# Patient Record
Sex: Male | Born: 1960 | Race: White | Hispanic: No | Marital: Married | Smoking: Former smoker
Health system: Southern US, Community
[De-identification: ages and names within clinical notes are randomized; demographics above are authoritative.]

## PROBLEM LIST (undated history)

## (undated) DIAGNOSIS — N400 Enlarged prostate without lower urinary tract symptoms: Secondary | ICD-10-CM

## (undated) DIAGNOSIS — E785 Hyperlipidemia, unspecified: Secondary | ICD-10-CM

## (undated) HISTORY — DX: Hyperlipidemia, unspecified: E78.5

## (undated) HISTORY — DX: Benign prostatic hyperplasia without lower urinary tract symptoms: N40.0

## (undated) HISTORY — PX: KNEE ARTHROSCOPY: SUR90

---

## 2003-06-26 LAB — PULMONARY FUNCTION TEST

## 2005-02-12 ENCOUNTER — Emergency Department: Payer: Self-pay | Admitting: Unknown Physician Specialty

## 2005-02-15 ENCOUNTER — Emergency Department: Payer: Self-pay | Admitting: Internal Medicine

## 2005-02-19 ENCOUNTER — Emergency Department: Payer: Self-pay | Admitting: Emergency Medicine

## 2005-02-23 ENCOUNTER — Emergency Department: Payer: Self-pay | Admitting: Emergency Medicine

## 2005-02-26 ENCOUNTER — Emergency Department: Payer: Self-pay | Admitting: Emergency Medicine

## 2005-03-12 ENCOUNTER — Emergency Department: Payer: Self-pay | Admitting: Emergency Medicine

## 2015-12-13 ENCOUNTER — Encounter: Payer: Self-pay | Admitting: Family Medicine

## 2015-12-13 ENCOUNTER — Ambulatory Visit (INDEPENDENT_AMBULATORY_CARE_PROVIDER_SITE_OTHER): Payer: TRICARE For Life (TFL) | Admitting: Family Medicine

## 2015-12-13 VITALS — BP 132/90 | HR 66 | Temp 98.4°F | Ht 70.75 in | Wt 204.7 lb

## 2015-12-13 DIAGNOSIS — R030 Elevated blood-pressure reading, without diagnosis of hypertension: Secondary | ICD-10-CM | POA: Diagnosis not present

## 2015-12-13 NOTE — Progress Notes (Signed)
Patient ID: Troy Mcguire, male   DOB: [birth year], 55 y.o.   MRN: [id number] Patient presents to clinic today to establish care.   Acute issue: Borderline diastolic BP noted upon recheck of BP. He does not currently monitor BP at home and does not have a history of HTN. He reports monitoring his BP several years ago when he stopped smoking but has not monitored this recently. Denies chest pain, palpitations, headaches, nosebleeds, numbness, tingling, SOB, dyspnea, and edema.  No history of HTN as noted and no previous treatments for BP. He also does not monitor his salt intake but does exercise regularly.  Health Maintenance: Dental -- Has dentures and sees dentist annually.  Vision -- Yearly Immunizations -- Last tetanus given 2 years ago at the urgent care Colonoscopy --Needed  Walking for exercise approximately 3 miles a day without cardiopulmonary symptoms. Diet is a modified heart healthy diet but patient reports that he does not monitor his salt intake. Does not eat as many vegetables as he would like to but has a garden and plans to increase vegetable intake.   Review of Systems  Constitutional: Negative for chills.  HENT: Negative for congestion and nosebleeds.   Eyes: Negative for blurred vision and double vision.  Respiratory: Negative for cough, shortness of breath and wheezing.   Cardiovascular: Negative for chest pain, palpitations, orthopnea, claudication, leg swelling and PND.  Gastrointestinal: Negative for heartburn, nausea, vomiting, abdominal pain, blood in stool and melena.  Genitourinary: Negative for dysuria, urgency, frequency and hematuria.  Musculoskeletal: Negative for myalgias, back pain and joint pain.  Neurological: Negative for dizziness, tingling, sensory change, weakness and headaches.  Psychiatric/Behavioral:       Denies depressed or anxious mood   History reviewed. No pertinent past medical history.   Social History   Social History  . Marital  Status: Married    Spouse Name: N/A  . Number of Children: N/A  . Years of Education: N/A   Occupational History  . Not on file.   Social History Main Topics  . Smoking status: Former Smoker -- 1.00 packs/day for 30 years    Types: Cigars, Cigarettes    Quit date: 12/13/2010  . Smokeless tobacco: Never Used  . Alcohol Use: 1.2 oz/week    2 Standard drinks or equivalent per week     Comment: 2 drinks/beer/wine weekly.   . Drug Use: Not on file  . Sexual Activity: Not on file   Other Topics Concern  . 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 No pertinent past surgical history.  Family History  Problem Relation Age of Onset  . Heart disease Mother   . Leukemia Father     No Known Allergies  No current outpatient prescriptions on file prior to visit.   No current facility-administered medications on file prior to visit.    BP 130/90 mmHg  Pulse 66  Temp(Src) 98.4 F (36.9 C)  Ht 5' 10.75" (1.797 m)  Wt 204 lb 11.2 oz (92.851 kg)  BMI 28.75 kg/m2  SpO2 98%     BP 130/90 mmHg  Pulse 66  Temp(Src) 98.4 F (36.9 C)  Ht 5' 10.75" (1.797 m)  Wt 204 lb 11.2 oz (92.851 kg)  BMI 28.75 kg/m2  SpO2 98%  Physical Exam  Constitutional: He is oriented to person, place, and time and well-developed, well-nourished, and in no distress.  HENT:  Head: Normocephalic.  Mouth/Throat: Oropharynx is clear and moist. No oropharyngeal exudate.  Eyes: Pupils are equal, round, and reactive to light. No scleral icterus.  Neck: Neck supple.  Cardiovascular: Normal rate, regular rhythm, normal heart sounds and intact distal pulses.   Pulmonary/Chest: Effort normal and breath sounds normal. He has no wheezes. He has no rales.  Abdominal: Soft. Bowel sounds are normal. There is no tenderness.  Lymphadenopathy:    He has no cervical adenopathy.  Neurological: He is alert and oriented to person, place, and time. Gait  normal. Coordination normal.  Skin: Skin is warm and dry.  Psychiatric: Mood, memory, affect and judgment normal.    Assessment/Plan: 1. Prehypertension Borderline diastolic of 90 noted with 2 readings during assessment today. Advised BP monitoring and provided parameters to report. Patient is scheduled for an upcoming physical and he has been advised to monitor his BP and bring readings and home cuff with him to his next visit if BP is noted to be WNL using the parameters provided for him. Further advised patient to follow up sooner if readings are >140/90. Reviewed and recommended DASH diet with written instructions provided. Patient will communicate readings via MYCHART.  Future lab orders have been placed and patient will schedule these when he is fasting prior to his upcoming physical.  Advised patient that a screening colonoscopy is recommended. He stated that he will consider this and let me know at his upcoming physical. Also discussed release of record form if he is interested in sharing his last physical that was completed at urgent care approximately 2 years ago per patient.  Return for physical as scheduled. Patient voiced understanding and agreed with plan.  - CBC with Differential/Platelet; Future - Basic metabolic panel; Future - Hepatic function panel; Future - Lipid panel; Future - PSA; Future  Roddie McJulia Carrell Rahmani, FNP-C  .

## 2015-12-13 NOTE — Patient Instructions (Signed)
It was a pleasure to see you today!  Please monitor your BP. Minimal Blood Pressure Goal= AVERAGE < 140/90; Ideal is an AVERAGE < 135/85. This AVERAGE should be calculated from @ least 5-7 BP readings taken @ different times of day on different days of week. You should not respond to isolated BP readings , but rather the AVERAGE for that week .Please bring your blood pressure cuff to office visits to verify that it is reliable.It can also be checked against the blood pressure device at the pharmacy. Finger or wrist cuffs are not dependable; an arm cuff is.    DASH Eating Plan DASH stands for "Dietary Approaches to Stop Hypertension." The DASH eating plan is a healthy eating plan that has been shown to reduce high blood pressure (hypertension). Additional health benefits may include reducing the risk of type 2 diabetes mellitus, heart disease, and stroke. The DASH eating plan may also help with weight loss. WHAT DO I NEED TO KNOW ABOUT THE DASH EATING PLAN? For the DASH eating plan, you will follow these general guidelines:  Choose foods with a percent daily value for sodium of less than 5% (as listed on the food label).  Use salt-free seasonings or herbs instead of table salt or sea salt.  Check with your health care provider or pharmacist before using salt substitutes.  Eat lower-sodium products, often labeled as "lower sodium" or "no salt added."  Eat fresh foods.  Eat more vegetables, fruits, and low-fat dairy products.  Choose whole grains. Look for the word "whole" as the first word in the ingredient list.  Choose fish and skinless chicken or [place] more often than red meat. Limit fish, poultry, and meat to 6 oz (170 g) each day.  Limit sweets, desserts, sugars, and sugary drinks.  Choose heart-healthy fats.  Limit cheese to 1 oz (28 g) per day.  Eat more home-cooked food and less restaurant, buffet, and fast food.  Limit fried foods.  Cook foods using methods other than  frying.  Limit canned vegetables. If you do use them, rinse them well to decrease the sodium.  When eating at a restaurant, ask that your food be prepared with less salt, or no salt if possible. WHAT FOODS CAN I EAT? Seek help from a dietitian for individual calorie needs. Grains Whole grain or whole wheat bread. Brown rice. Whole grain or whole wheat pasta. Quinoa, bulgur, and whole grain cereals. Low-sodium cereals. Corn or whole wheat flour tortillas. Whole grain cornbread. Whole grain crackers. Low-sodium crackers. Vegetables Fresh or frozen vegetables (raw, steamed, roasted, or grilled). Low-sodium or reduced-sodium tomato and vegetable juices. Low-sodium or reduced-sodium tomato sauce and paste. Low-sodium or reduced-sodium canned vegetables.  Fruits All fresh, canned (in natural juice), or frozen fruits. Meat and Other Protein Products Ground beef (85% or leaner), grass-fed beef, or beef trimmed of fat. Skinless chicken or [place]. Ground chicken or [place]. Pork trimmed of fat. All fish and seafood. Eggs. Dried beans, peas, or lentils. Unsalted nuts and seeds. Unsalted canned beans. Dairy Low-fat dairy products, such as skim or 1% milk, 2% or reduced-fat cheeses, low-fat ricotta or cottage cheese, or plain low-fat yogurt. Low-sodium or reduced-sodium cheeses. Fats and Oils Tub margarines without trans fats. Light or reduced-fat mayonnaise and salad dressings (reduced sodium). Avocado. Safflower, olive, or canola oils. Natural peanut or almond butter. Other Unsalted popcorn and pretzels. The items listed above may not be a complete list of recommended foods or beverages. Contact your dietitian for more options. WHAT  FOODS ARE NOT RECOMMENDED? Grains White bread. White pasta. White rice. Refined cornbread. Bagels and croissants. Crackers that contain trans fat. Vegetables Creamed or fried vegetables. Vegetables in a cheese sauce. Regular canned vegetables. Regular canned tomato sauce  and paste. Regular tomato and vegetable juices. Fruits Dried fruits. Canned fruit in light or heavy syrup. Fruit juice. Meat and Other Protein Products Fatty cuts of meat. Ribs, chicken wings, bacon, sausage, bologna, salami, chitterlings, fatback, hot dogs, bratwurst, and packaged luncheon meats. Salted nuts and seeds. Canned beans with salt. Dairy Whole or 2% milk, cream, half-and-half, and cream cheese. Whole-fat or sweetened yogurt. Full-fat cheeses or blue cheese. Nondairy creamers and whipped toppings. Processed cheese, cheese spreads, or cheese curds. Condiments Onion and garlic salt, seasoned salt, table salt, and sea salt. Canned and packaged gravies. Worcestershire sauce. Tartar sauce. Barbecue sauce. Teriyaki sauce. Soy sauce, including reduced sodium. Steak sauce. Fish sauce. Oyster sauce. Cocktail sauce. Horseradish. Ketchup and mustard. Meat flavorings and tenderizers. Bouillon cubes. Hot sauce. Tabasco sauce. Marinades. Taco seasonings. Relishes. Fats and Oils Butter, stick margarine, lard, shortening, ghee, and bacon fat. Coconut, palm kernel, or palm oils. Regular salad dressings. Other Pickles and olives. Salted popcorn and pretzels. The items listed above may not be a complete list of foods and beverages to avoid. Contact your dietitian for more information. WHERE CAN I FIND MORE INFORMATION? National Heart, Lung, and Blood Institute: CablePromo.itwww.nhlbi.nih.gov/health/health-topics/topics/dash/   This information is not intended to replace advice given to you by your health care provider. Make sure you discuss any questions you have with your health care provider.   Document Released: 06/01/2011 Document Revised: 07/03/2014 Document Reviewed: 04/16/2013 Elsevier Interactive Patient Education [employer]2016 Elsevier Inc.

## 2016-01-17 ENCOUNTER — Other Ambulatory Visit (INDEPENDENT_AMBULATORY_CARE_PROVIDER_SITE_OTHER): Payer: TRICARE For Life (TFL)

## 2016-01-17 DIAGNOSIS — R030 Elevated blood-pressure reading, without diagnosis of hypertension: Secondary | ICD-10-CM

## 2016-01-17 LAB — BASIC METABOLIC PANEL
BUN: 13 mg/dL (ref 6–23)
CHLORIDE: 106 meq/L (ref 96–112)
CO2: 28 mEq/L (ref 19–32)
Calcium: 9.4 mg/dL (ref 8.4–10.5)
Creatinine, Ser: 1.2 mg/dL (ref 0.40–1.50)
GFR: 66.27 mL/min (ref >60.00)
Glucose, Bld: 89 mg/dL (ref 70–99)
POTASSIUM: 4.8 meq/L (ref 3.5–5.1)
SODIUM: 140 meq/L (ref 135–145)

## 2016-01-17 LAB — CBC WITH DIFFERENTIAL/PLATELET
BASOS ABS: 0 10*3/uL (ref 0.0–0.1)
BASOS PCT: 0.6 % (ref 0.0–3.0)
EOS ABS: 0.3 10*3/uL (ref 0.0–0.7)
Eosinophils Relative: 3.7 % (ref 0.0–5.0)
HEMATOCRIT: 45.4 % (ref 39.0–52.0)
Hemoglobin: 15.3 g/dL (ref 13.0–17.0)
LYMPHS ABS: 2.2 10*3/uL (ref 0.7–4.0)
Lymphocytes Relative: 30.9 % (ref 12.0–46.0)
MCHC: 33.7 g/dL (ref 30.0–36.0)
MCV: 90.8 fl (ref 78.0–100.0)
MONO ABS: 0.6 10*3/uL (ref 0.1–1.0)
Monocytes Relative: 7.8 % (ref 3.0–12.0)
NEUTROS ABS: 4.1 10*3/uL (ref 1.4–7.7)
NEUTROS PCT: 57 % (ref 43.0–77.0)
PLATELETS: 214 10*3/uL (ref 150.0–400.0)
RBC: 5 Mil/uL (ref 4.22–5.81)
RDW: 14 % (ref 11.5–15.5)
WBC: 7.1 10*3/uL (ref 4.0–10.5)

## 2016-01-17 LAB — POC URINALSYSI DIPSTICK (AUTOMATED)
BILIRUBIN UA: NEGATIVE
Blood, UA: NEGATIVE
Glucose, UA: NEGATIVE
KETONES UA: NEGATIVE
LEUKOCYTES UA: NEGATIVE
Nitrite, UA: NEGATIVE
PH UA: 5.5
PROTEIN UA: NEGATIVE
Urobilinogen, UA: 0.2

## 2016-01-17 LAB — HEPATIC FUNCTION PANEL
ALT: 18 U/L (ref 0–53)
AST: 16 U/L (ref 0–37)
Albumin: 4.2 g/dL (ref 3.5–5.2)
Alkaline Phosphatase: 65 U/L (ref 39–117)
BILIRUBIN DIRECT: 0.1 mg/dL (ref 0.0–0.3)
BILIRUBIN TOTAL: 0.4 mg/dL (ref 0.2–1.2)
Total Protein: 6.4 g/dL (ref 6.0–8.3)

## 2016-01-17 LAB — LIPID PANEL
CHOL/HDL RATIO: 6
CHOLESTEROL: 186 mg/dL (ref 0–200)
HDL: 30.5 mg/dL — ABNORMAL LOW (ref >39.00)
LDL CALC: 123 mg/dL — AB (ref 0–99)
NonHDL: 155.98
Triglycerides: 164 mg/dL — ABNORMAL HIGH (ref 0.0–149.0)
VLDL: 32.8 mg/dL (ref 0.0–40.0)

## 2016-01-17 LAB — TSH: TSH: 1.69 u[IU]/mL (ref 0.35–4.50)

## 2016-01-17 LAB — PSA: PSA: 1.66 ng/mL (ref 0.10–4.00)

## 2016-01-24 ENCOUNTER — Ambulatory Visit (INDEPENDENT_AMBULATORY_CARE_PROVIDER_SITE_OTHER): Payer: TRICARE For Life (TFL) | Admitting: Family Medicine

## 2016-01-24 ENCOUNTER — Ambulatory Visit: Payer: Self-pay | Admitting: Family Medicine

## 2016-01-24 ENCOUNTER — Encounter: Payer: Self-pay | Admitting: Family Medicine

## 2016-01-24 VITALS — BP 130/88 | HR 62 | Temp 97.5°F | Ht 69.5 in | Wt 205.4 lb

## 2016-01-24 DIAGNOSIS — R030 Elevated blood-pressure reading, without diagnosis of hypertension: Secondary | ICD-10-CM

## 2016-01-24 DIAGNOSIS — Z23 Encounter for immunization: Secondary | ICD-10-CM

## 2016-01-24 DIAGNOSIS — E784 Other hyperlipidemia: Secondary | ICD-10-CM | POA: Diagnosis not present

## 2016-01-24 DIAGNOSIS — Z0001 Encounter for general adult medical examination with abnormal findings: Secondary | ICD-10-CM | POA: Diagnosis not present

## 2016-01-24 DIAGNOSIS — E785 Hyperlipidemia, unspecified: Secondary | ICD-10-CM

## 2016-01-24 MED ORDER — TETANUS-DIPHTH-ACELL PERTUSSIS 5-2.5-18.5 LF-MCG/0.5 IM SUSP: 0.5000 mL | Freq: Once | INTRAMUSCULAR | Status: DC

## 2016-01-24 NOTE — Patient Instructions (Signed)
It was a pleasure seeking you today! Please schedule a 6 month follow up for a cholesterol check and we can discuss anything else at that time.  Please let me know if you would like to schedule a colonoscopy.   DASH Eating Plan DASH stands for "Dietary Approaches to Stop Hypertension." The DASH eating plan is a healthy eating plan that has been shown to reduce high blood pressure (hypertension). Additional health benefits may include reducing the risk of type 2 diabetes mellitus, heart disease, and stroke. The DASH eating plan may also help with weight loss. WHAT DO I NEED TO KNOW ABOUT THE DASH EATING PLAN? For the DASH eating plan, you will follow these general guidelines:  Choose foods with a percent daily value for sodium of less than 5% (as listed on the food label).  Use salt-free seasonings or herbs instead of table salt or sea salt.  Check with your health care provider or pharmacist before using salt substitutes.  Eat lower-sodium products, often labeled as "lower sodium" or "no salt added."  Eat fresh foods.  Eat more vegetables, fruits, and low-fat dairy products.  Choose whole grains. Look for the word "whole" as the first word in the ingredient list.  Choose fish and skinless chicken or [place] more often than red meat. Limit fish, poultry, and meat to 6 oz (170 g) each day.  Limit sweets, desserts, sugars, and sugary drinks.  Choose heart-healthy fats.  Limit cheese to 1 oz (28 g) per day.  Eat more home-cooked food and less restaurant, buffet, and fast food.  Limit fried foods.  Cook foods using methods other than frying.  Limit canned vegetables. If you do use them, rinse them well to decrease the sodium.  When eating at a restaurant, ask that your food be prepared with less salt, or no salt if possible. WHAT FOODS CAN I EAT? Seek help from a dietitian for individual calorie needs. Grains Whole grain or whole wheat bread. Brown rice. Whole grain or whole  wheat pasta. Quinoa, bulgur, and whole grain cereals. Low-sodium cereals. Corn or whole wheat flour tortillas. Whole grain cornbread. Whole grain crackers. Low-sodium crackers. Vegetables Fresh or frozen vegetables (raw, steamed, roasted, or grilled). Low-sodium or reduced-sodium tomato and vegetable juices. Low-sodium or reduced-sodium tomato sauce and paste. Low-sodium or reduced-sodium canned vegetables.  Fruits All fresh, canned (in natural juice), or frozen fruits. Meat and Other Protein Products Ground beef (85% or leaner), grass-fed beef, or beef trimmed of fat. Skinless chicken or [place]. Ground chicken or [place]. Pork trimmed of fat. All fish and seafood. Eggs. Dried beans, peas, or lentils. Unsalted nuts and seeds. Unsalted canned beans. Dairy Low-fat dairy products, such as skim or 1% milk, 2% or reduced-fat cheeses, low-fat ricotta or cottage cheese, or plain low-fat yogurt. Low-sodium or reduced-sodium cheeses. Fats and Oils Tub margarines without trans fats. Light or reduced-fat mayonnaise and salad dressings (reduced sodium). Avocado. Safflower, olive, or canola oils. Natural peanut or almond butter. Other Unsalted popcorn and pretzels. The items listed above may not be a complete list of recommended foods or beverages. Contact your dietitian for more options. WHAT FOODS ARE NOT RECOMMENDED? Grains White bread. White pasta. White rice. Refined cornbread. Bagels and croissants. Crackers that contain trans fat. Vegetables Creamed or fried vegetables. Vegetables in a cheese sauce. Regular canned vegetables. Regular canned tomato sauce and paste. Regular tomato and vegetable juices. Fruits Dried fruits. Canned fruit in light or heavy syrup. Fruit juice. Meat and Other Protein Products Fatty  cuts of meat. Ribs, chicken wings, bacon, sausage, bologna, salami, chitterlings, fatback, hot dogs, bratwurst, and packaged luncheon meats. Salted nuts and seeds. Canned beans with  salt. Dairy Whole or 2% milk, cream, half-and-half, and cream cheese. Whole-fat or sweetened yogurt. Full-fat cheeses or blue cheese. Nondairy creamers and whipped toppings. Processed cheese, cheese spreads, or cheese curds. Condiments Onion and garlic salt, seasoned salt, table salt, and sea salt. Canned and packaged gravies. Worcestershire sauce. Tartar sauce. Barbecue sauce. Teriyaki sauce. Soy sauce, including reduced sodium. Steak sauce. Fish sauce. Oyster sauce. Cocktail sauce. Horseradish. Ketchup and mustard. Meat flavorings and tenderizers. Bouillon cubes. Hot sauce. Tabasco sauce. Marinades. Taco seasonings. Relishes. Fats and Oils Butter, stick margarine, lard, shortening, ghee, and bacon fat. Coconut, palm kernel, or palm oils. Regular salad dressings. Other Pickles and olives. Salted popcorn and pretzels. The items listed above may not be a complete list of foods and beverages to avoid. Contact your dietitian for more information. WHERE CAN I FIND MORE INFORMATION? National Heart, Lung, and Blood Institute: [employer]Prom[employer].it   This information is not intended to replace advice given to you by your health care provider. Make sure you discuss any questions you have with your health care provider.   Document Released: 06/01/2011 Document Revised: 07/03/2014 Document Reviewed: 04/16/2013 Elsevier Interactive Patient Education [employer]

## 2016-01-24 NOTE — Progress Notes (Signed)
Pre visit review using our clinic review tool, if applicable. No additional management support is needed unless otherwise documented below in the visit note. 

## 2016-01-24 NOTE — Progress Notes (Signed)
Subjective:    Patient ID: Troy Troy Mcguire, male    DOB: [birth year], 55 y.o.   MRN: [id number]  HPI  Mr. Troy Troy Mcguire is a 55 year old male who presents today for routine care. He is a former smoker who quit in 2012.   Prehypertension:  Monitoring BP in the evening at home 3 to 4 times/week and reports BP average of 110-115/70s. Denies chest pain, palpitations, SOB, numbness, tingling, weakness, headache, nosebleeds, or edema.   Exercise: Walks approximately 3 miles/day without cardiopulmonary symptoms.    Diet: Does not monitor his salt intake or follow a particular heart healthy diet.  Colonoscopy recommended. He stated that he "will think about this and follow up if interested". We discussed the importance of screening and early detection for colon cancer. He denies melena, hematochezia, or stool that is narrow in caliber.    Review of Systems  Constitutional: Negative for chills, fatigue and fever.  HENT: Negative for nosebleeds, postnasal drip, rhinorrhea and sneezing.   Eyes: Negative for visual disturbance.  Respiratory: Negative for cough, choking, shortness of breath and wheezing.   Cardiovascular: Negative for chest pain and palpitations.  Gastrointestinal: Negative for abdominal pain, constipation, diarrhea, nausea and vomiting.  Endocrine: Negative for polydipsia, polyphagia and polyuria.  Genitourinary: Negative for dysuria, flank pain, frequency, hematuria and urgency.  Musculoskeletal: Negative for myalgias.  Skin: Negative for rash.  Neurological: Negative for dizziness, tremors, light-headedness and headaches.  Psychiatric/Behavioral:       Denies depressed or anxious mood   No past medical history on file.   Social History   Social History  . Marital status: Married    Spouse name: N/A  . Number of children: N/A  . Years of education: N/A   Occupational History  . Not on file.   Social History Main Topics  . Smoking status: Former Smoker   Packs/day: 1.00    Years: 30.00    Types: Cigars, Cigarettes    Quit date: 12/13/2010  . Smokeless tobacco: Never Used  . Alcohol use 1.2 oz/week    2 Standard drinks or equivalent per week     Comment: 2 drinks/beer/wine weekly.   . Drug use: No  . Sexual activity: Yes   Other Topics Concern  . 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  Family History  Problem Relation Age of Onset  . Heart disease Mother   . Leukemia Father     No Known Allergies  No current outpatient prescriptions on file prior to visit.   No current facility-administered medications on file prior to visit.     BP 130/88   Pulse 62   Temp 97.5 F (36.4 C) (Oral)   Ht 5' 9.5" (1.765 m)   Wt 205 lb 6.4 oz (93.2 kg)   SpO2 98%   BMI 29.90 kg/m       Objective:   Physical Exam  General -- alert, well-developed, NAD.  Neck --no thyromegaly , normal carotid pulse, no LAD HEENT--   R Ear-- normal L ear-- normal Throat symmetric, no redness or discharge. Face symmetric, sinuses not tender to palpation. Nose not congested. Lungs -- normal respiratory effort, no intercostal retractions, no accessory muscle use, and normal breath sounds.  Heart-- normal rate, regular rhythm, no murmur.  Abdomen-- Not distended, good bowel sounds,soft, non-tender. No rebound or rigidity. No mass,organomegaly. Rectal--Patient deferred this exam  today.  Advised this should be completed yearly. He stated this was completed by a provider at an urgent care clinic. Prostate--Patient deferred this exam today. Advised this should be completed yearly. He stated this was completed by a provider at an urgent care clinic. Extremities-- no pretibial edema bilaterally,   Neurologic--  alert & oriented X3. Speech normal, gait appropriate for age, strength symmetric and appropriate for age.  DTRs symmetric. EOMI, PERLA   Psych-- Cognition and judgment  appear intact. Cooperative with normal attention span and concentration. No anxious or depressed appearing.       Assessment & Plan:   1. Routine general adult medical examination with abnormal findings. 55 y.o. male presenting for annual physical.  Health Maintenance counseling: 1. Anticipatory guidance: Patient counseled regarding regular dental exams, eye exams, wearing seatbelts.  2. Risk factor reduction:  Advised patient of need for regular exercise and diet rich and fruits and vegetables to reduce risk of heart attack and stroke.  3. Immunizations/screenings/ancillary studies Immunization History  Administered Date(s) Administered  . Tdap 01/24/2016   Health Maintenance Due  Topic Date Due  . Hepatitis C Screening  07-01-1960  . HIV Screening  10/12/1973  . TETANUS/TDAP  10/12/1977  . COLONOSCOPY  10/12/2008   4. Prostate cancer screening- PSA below Lab Results  Component Value Date   PSA 1.66 01/17/2016   5. Colon cancer screening - Reviewed recommendation and advised screening colonoscopy for patient. He declined referral today and stated he will let me know if he is interested.  2. Prehypertension Retake of BP was WNL. He has had 2 previous borderline diastolic readings in this clinic.  Advised continued monitoring of BP and provided parameters to report. Further recommended healthy dietary habits and monitoring of salt intake.  3. Need for Tdap vaccination  - Tdap vaccine greater than or equal to 7yo IM  4. Dyslipidemia (high LDL; low HDL) Mildly elevated LDL and triglycerides with decreased HDL noted. Discussed with patient the importance of dietary changes and continued exercise to improve lipid levels. Also discussed the use of a statin and patient decided to make dietary changes for 6 months and follow up with another lipid level prior to initiation of statin therapy.  - Lipid panel; Future   Follow up in 6 months for lipid level.   Troy Mc,  FNP-C

## 2016-07-17 ENCOUNTER — Other Ambulatory Visit: Payer: TRICARE For Life (TFL)

## 2016-07-24 ENCOUNTER — Ambulatory Visit: Payer: TRICARE For Life (TFL) | Admitting: Family Medicine

## 2017-11-06 ENCOUNTER — Other Ambulatory Visit: Payer: Self-pay | Admitting: Family Medicine

## 2017-11-06 DIAGNOSIS — Z87891 Personal history of nicotine dependence: Secondary | ICD-10-CM

## 2017-11-09 ENCOUNTER — Ambulatory Visit
Admission: RE | Admit: 2017-11-09 | Discharge: 2017-11-09 | Disposition: A | Discharge status: Home / Self Care | Source: Ambulatory Visit | Attending: Family Medicine | Admitting: Family Medicine

## 2017-11-09 DIAGNOSIS — Z87891 Personal history of nicotine dependence: Secondary | ICD-10-CM

## 2017-11-15 ENCOUNTER — Other Ambulatory Visit: Payer: Self-pay | Admitting: Family Medicine

## 2017-11-15 DIAGNOSIS — K769 Liver disease, unspecified: Secondary | ICD-10-CM

## 2017-11-20 ENCOUNTER — Encounter: Payer: Self-pay | Admitting: Emergency Medicine

## 2017-11-20 ENCOUNTER — Ambulatory Visit (INDEPENDENT_AMBULATORY_CARE_PROVIDER_SITE_OTHER): Admitting: Emergency Medicine

## 2017-11-20 VITALS — BP 124/80 | HR 74

## 2017-11-20 DIAGNOSIS — R0600 Dyspnea, unspecified: Secondary | ICD-10-CM | POA: Diagnosis not present

## 2017-11-20 DIAGNOSIS — Z87891 Personal history of nicotine dependence: Secondary | ICD-10-CM | POA: Insufficient documentation

## 2017-11-20 DIAGNOSIS — R0602 Shortness of breath: Secondary | ICD-10-CM

## 2017-11-20 NOTE — Progress Notes (Signed)
Subjective:    Patient ID: Troy Mcguire, male    DOB: July 08, 1960, 57 y.o.   MRN: [id number]  HPI 57 year old former smoker (38 pack years), with minimal PMH. He is referred today for evaluation of possible COPD. He is able to exert but he is noticing more labored breathing. He has cough, happens most in winter months, often evenings. Minimally productive. He has had some allergies in the Spring. Frequent heartburn, uses TUMS prn. He hears some wheeze occasionally. No CP, rare tightness.   He was told that he had some mild obstruction on exit-spirometry from [place] [employer] in [place].   He underwent low-dose CT scan screening on 11/12/2017 that I have reviewed.  This was a lung RADS 2 study. There was a calcified right lower lobe nodule, and noncalcified pulmonary nodules that measured 5.2 mm or less in size.  Note was also made of low attenuation lesions in the liver that will need further characterization.  Review of Systems  Constitutional: Negative for fever and unexpected weight change.  HENT: Positive for sneezing. Negative for congestion, dental problem, ear pain, nosebleeds, postnasal drip, rhinorrhea, sinus pressure, sore throat and trouble swallowing.   Eyes: Negative for redness and itching.  Respiratory: Positive for cough and shortness of breath. Negative for chest tightness and wheezing.   Cardiovascular: Negative for palpitations and leg swelling.  Gastrointestinal: Negative for nausea and vomiting.  Genitourinary: Negative for dysuria.  Musculoskeletal: Negative for joint swelling.  Skin: Negative for rash.  Neurological: Negative for headaches.  Hematological: Does not bruise/bleed easily.  Psychiatric/Behavioral: Negative for dysphoric mood. The patient is not nervous/anxious.    No past medical history on file.   Family History  Problem Relation Age of Onset  . Heart disease Mother   . Leukemia Father      Social History   Socioeconomic History  . Marital  status: Married    Spouse name: Not on file  . Number of children: Not on file  . Years of education: Not on file  . Highest education level: Not on file  Occupational History  . Not on file  Social Needs  . Financial resource strain: Not on file  . Food insecurity:    Worry: Not on file    Inability: Not on file  . Transportation needs:    Medical: Not on file    Non-medical: Not on file  Tobacco Use  . Smoking status: Former Smoker    Packs/day: 1.00    Years: 30.00    Pack years: 30.00    Types: Cigars, Cigarettes    Last attempt to quit: 12/13/2010    Years since quitting: 6.9  . Smokeless tobacco: Never Used  Substance and Sexual Activity  . Alcohol use: Yes    Alcohol/week: 1.2 oz    Types: 2 Standard drinks or equivalent per week    Comment: 2 drinks/beer/wine weekly.   . Drug use: No  . Sexual activity: Yes  Lifestyle  . Physical activity:    Days per week: Not on file    Minutes per session: Not on file  . Stress: Not on file  Relationships  . Social connections:    Talks on phone: Not on file    Gets together: Not on file    Attends religious service: Not on file    Active member of club or organization: Not on file    Attends meetings of clubs or organizations: Not on file    Relationship status: Not  on file  . Intimate partner violence:    Fear of current or ex partner: Not on file    Emotionally abused: Not on file    Physically abused: Not on file    Forced sexual activity: Not on file  Other Topics Concern  . 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 Armed forces technical officer, Scientist, product/process development, logistics.  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  Has lived all over the [place]; [place], [place] and [place], [place].   No Known Allergies   Outpatient Medications Prior to Visit  Medication Sig Dispense Refill  . terazosin (HYTRIN) 1 MG capsule Take 1 mg by  mouth at bedtime.    . terbinafine (LAMISIL) 250 MG tablet Take 250 mg by mouth daily.     No facility-administered medications prior to visit.         Objective:   Physical Exam Vitals:   11/20/17 1401  BP: 124/80  Pulse: 74  SpO2: 100%   Gen: Pleasant, well-nourished, in no distress,  normal affect  ENT: No lesions,  mouth clear,  oropharynx clear, no postnasal drip  Neck: No JVD, no stridor  Lungs: No use of accessory muscles, mild exp wheeze on a forced exp  Cardiovascular: RRR, heart sounds normal, no murmur or gallops, no peripheral edema  Musculoskeletal: No deformities, no cyanosis or clubbing  Neuro: alert, non focal  Skin: Warm, no lesions or rash    Assessment & Plan:  Dyspnea Based on his symptoms, history of tobacco use, CT chest results and is remote spirometry from the [employer]	[place] I suspect that he does have some degree of COPD that is now becoming clinically relevant.  Note that he also had smoke exposure on his deployment to [place].  We will perform pulmonary function testing, plan for bronchodilator therapy depending on the results.  Former tobacco use He has a lung RADS 2 CT scan of the chest, needs a repeat scan in May 2020  Levy Pupa, MD, PhD 11/20/2017, 2:34 PM [facility] [phone] or if no answer [phone]

## 2017-11-20 NOTE — Assessment & Plan Note (Signed)
Based on his symptoms, history of tobacco use, CT chest results and is remote spirometry from the [employer]	[place] I suspect that he does have some degree of COPD that is now becoming clinically relevant.  Note that he also had smoke exposure on his deployment to [place].  We will perform pulmonary function testing, plan for bronchodilator therapy depending on the results.

## 2017-11-20 NOTE — Assessment & Plan Note (Signed)
He has a lung RADS 2 CT scan of the chest, needs a repeat scan in May 2020

## 2017-11-20 NOTE — Patient Instructions (Signed)
We will perform full pulmonary function testing Follow with Dr Delton Coombes next available with PFT to review together. Based on results we may decide to start a breathing medication.  We reviewed your LD Ct scan of the chest. You will need a repeat scan in a year

## 2017-11-23 ENCOUNTER — Ambulatory Visit
Admission: RE | Admit: 2017-11-23 | Discharge: 2017-11-23 | Disposition: A | Discharge status: Home / Self Care | Source: Ambulatory Visit | Attending: Family Medicine | Admitting: Family Medicine

## 2017-11-23 DIAGNOSIS — K769 Liver disease, unspecified: Secondary | ICD-10-CM

## 2017-12-10 ENCOUNTER — Telehealth: Payer: Self-pay | Admitting: Cardiology

## 2017-12-10 NOTE — Telephone Encounter (Signed)
[facility] on 12/10/17, Appt 12/14/17 @ 3:40PM, [place]

## 2017-12-13 NOTE — Progress Notes (Signed)
Cardiology Office Note   Date:  12/14/2017   ID:  Troy Mcguire, DOB Mar 16, 1961, MRN [id number]  PCP:  Gwenlyn Found, MD  Cardiologist:   No primary care provider on file. Referring:  Self   Chief Complaint  Patient presents with  . Shortness of Breath      History of Present Illness: Troy Mcguire is a 57 y.o. male who presents for evaluation of  aortic atherosclerosis.  This was noted recently on a CT that he had because of his past smoking history.  There was no mention of coronary calcification.  He does get some mild dyspnea with exertion.  He tries to walk about a mile per day.  He does not bring on any chest pressure, neck or arm discomfort.  He does not report palpitations, presyncope or syncope.  He has no weight gain or edema.   Past Medical History:  Diagnosis Date  . BPH (benign prostatic hyperplasia)   . Dyslipidemia     Past Surgical History:  Procedure Laterality Date  . KNEE ARTHROSCOPY Right      Current Outpatient Medications  Medication Sig Dispense Refill  . terazosin (HYTRIN) 1 MG capsule Take 1 mg by mouth at bedtime.    . terbinafine (LAMISIL) 250 MG tablet Take 250 mg by mouth daily.     No current facility-administered medications for this visit.     Allergies:   Patient has no known allergies.    Social History:  The patient  reports that he quit smoking about 7 years ago. His smoking use included cigars and cigarettes. He has a 40.00 pack-year smoking history. He has never used smokeless tobacco. He reports that he drinks about 1.2 oz of alcohol per week. He reports that he does not use drugs.   Family History:  The patient's family history includes Heart disease (age of onset: 23) in his mother; Leukemia (age of onset: 50) in his father.    ROS:  Please see the history of present illness.   Otherwise, review of systems are positive for none.   All other systems are reviewed and negative.    PHYSICAL EXAM: VS:  BP  112/80 (BP Location: Left Arm, Patient Position: Sitting, Cuff Size: Large)   Pulse (!) 54   Ht 5\' 11"  (1.803 m)   Wt 214 lb 8 oz (97.3 kg)   BMI 29.92 kg/m  , BMI Body mass index is 29.92 kg/m. GENERAL:  Well appearing HEENT:  Pupils equal round and reactive, fundi not visualized, oral mucosa unremarkable NECK:  No jugular venous distention, waveform within normal limits, carotid upstroke brisk and symmetric, no bruits, no thyromegaly LYMPHATICS:  No cervical, inguinal adenopathy LUNGS:  Clear to auscultation bilaterally BACK:  No CVA tenderness CHEST:  Unremarkable HEART:  PMI not displaced or sustained,S1 and S2 within normal limits, no S3, no S4, no clicks, no rubs, no murmurs ABD:  Flat, positive bowel sounds normal in frequency in pitch, no bruits, no rebound, no guarding, no midline pulsatile mass, no hepatomegaly, no splenomegaly EXT:  2 plus pulses throughout, no edema, no cyanosis no clubbing SKIN:  No rashes no nodules NEURO:  Cranial nerves II through XII grossly intact, motor grossly intact throughout PSYCH:  Cognitively intact, oriented to person place and time    EKG:  EKG is ordered today. The ekg ordered today demonstrates sinus rhythm, rate 54, axis within normal limits, intervals within normal limits, poor anterior R wave progression, no acute  ST-T wave changes.   Recent Labs: No results found for requested labs within last 8760 hours.    Lipid Panel    Component Value Date/Time   CHOL 186 01/17/2016 0824   TRIG 164.0 (H) 01/17/2016 0824   HDL 30.50 (L) 01/17/2016 0824   CHOLHDL 6 01/17/2016 0824   VLDL 32.8 01/17/2016 0824   LDLCALC 123 (H) 01/17/2016 0824      Wt Readings from Last 3 Encounters:  12/14/17 214 lb 8 oz (97.3 kg)  01/24/16 205 lb 6.4 oz (93.2 kg)  12/13/15 204 lb 11.2 oz (92.9 kg)      Other studies Reviewed: Additional studies/ records that were reviewed today include: Labs. Review of the above records demonstrates:  Please see  elsewhere in the note.     ASSESSMENT AND PLAN:  SOB: The patient has known atherosclerosis of the aorta.  He has a very slightly abnormal EKG with poor anterior R wave progression and I could not exclude an old anteroseptal infarct.  He does have some dyspnea. I will bring the patient back for a POET (Plain Old Exercise Test). This will allow me to screen for obstructive coronary disease, risk stratify and very importantly provide a prescription for exercise.  DYSLIPIDEMIA: Interestingly his Framingham risk score calculates to be slightly above 10.  We had long discussion about risk benefits of primary prevention with aspirin and it is really of patient choice at his level.  He is low risk for bleeding.  Predominant benefit would probably be reduced nonfatal myocardial infarction.  He will consider this.  He does not want to consider statin and I think this is not reasonable if of course he changes his diet and exercise.  If he does this and gets a repeat lipid profile that remains at current levels (LDL 143, HDL 31, triglycerides 173) he should consider statin.   Current medicines are reviewed at length with the patient today.  The patient does not have concerns regarding medicines.  The following changes have been made:  no change  Labs/ tests ordered today include:   Orders Placed This Encounter  Procedures  . EXERCISE TOLERANCE TEST (ETT)  . EKG 12-Lead     Disposition:   FU with me as needed    Signed, Rollene RotundaJames Maxie Debose, MD  12/14/2017 5:17 PM    [facility]

## 2017-12-14 ENCOUNTER — Encounter: Payer: Self-pay | Admitting: Cardiology

## 2017-12-14 ENCOUNTER — Ambulatory Visit (INDEPENDENT_AMBULATORY_CARE_PROVIDER_SITE_OTHER): Admitting: Cardiology

## 2017-12-14 VITALS — BP 112/80 | HR 54 | Ht 71.0 in | Wt 214.5 lb

## 2017-12-14 DIAGNOSIS — I700 Atherosclerosis of aorta: Secondary | ICD-10-CM

## 2017-12-14 DIAGNOSIS — E785 Hyperlipidemia, unspecified: Secondary | ICD-10-CM | POA: Diagnosis not present

## 2017-12-14 DIAGNOSIS — R0602 Shortness of breath: Secondary | ICD-10-CM

## 2017-12-14 NOTE — Patient Instructions (Signed)
Medication Instructions:  Continue current medications  If you need a refill on your cardiac medications before your next appointment, please call your pharmacy.  Labwork: None Ordered   Testing/Procedures: Your physician has requested that you have an exercise tolerance test. For further information please visit [email or web address]. Please also follow instruction sheet, as given.   Follow-Up: Your physician wants you to follow-up in: As Needed.     Thank you for choosing [facility]!!       

## 2017-12-18 ENCOUNTER — Telehealth (HOSPITAL_COMMUNITY): Payer: Self-pay

## 2017-12-18 NOTE — Telephone Encounter (Signed)
Encounter complete. 

## 2017-12-19 ENCOUNTER — Ambulatory Visit (INDEPENDENT_AMBULATORY_CARE_PROVIDER_SITE_OTHER): Admitting: Emergency Medicine

## 2017-12-19 ENCOUNTER — Encounter: Payer: Self-pay | Admitting: Emergency Medicine

## 2017-12-19 DIAGNOSIS — R05 Cough: Secondary | ICD-10-CM

## 2017-12-19 DIAGNOSIS — Z87891 Personal history of nicotine dependence: Secondary | ICD-10-CM | POA: Diagnosis not present

## 2017-12-19 DIAGNOSIS — R0600 Dyspnea, unspecified: Secondary | ICD-10-CM | POA: Diagnosis not present

## 2017-12-19 DIAGNOSIS — J449 Chronic obstructive pulmonary disease, unspecified: Secondary | ICD-10-CM

## 2017-12-19 DIAGNOSIS — R059 Cough, unspecified: Secondary | ICD-10-CM

## 2017-12-19 LAB — PULMONARY FUNCTION TEST
DL/VA % pred: 94 %
DL/VA: 4.4 ml/min/mmHg/L
DLCO unc % pred: 99 %
DLCO unc: 32.77 ml/min/mmHg
FEF 25-75 PRE: 2.28 L/s
FEF 25-75 Post: 2.89 L/sec
FEF2575-%Change-Post: 26 %
FEF2575-%PRED-PRE: 74 %
FEF2575-%Pred-Post: 94 %
FEV1-%Change-Post: 7 %
FEV1-%PRED-POST: 97 %
FEV1-%Pred-Pre: 90 %
FEV1-POST: 3.62 L
FEV1-Pre: 3.36 L
FEV1FVC-%CHANGE-POST: 0 %
FEV1FVC-%Pred-Pre: 92 %
FEV6-%CHANGE-POST: 5 %
FEV6-%PRED-PRE: 102 %
FEV6-%Pred-Post: 108 %
FEV6-POST: 5.07 L
FEV6-PRE: 4.78 L
FEV6FVC-%Change-Post: 0 %
FEV6FVC-%PRED-POST: 104 %
FEV6FVC-%PRED-PRE: 105 %
FVC-%Change-Post: 7 %
FVC-%PRED-PRE: 98 %
FVC-%Pred-Post: 105 %
FVC-POST: 5.14 L
FVC-Pre: 4.79 L
POST FEV6/FVC RATIO: 99 %
Post FEV1/FVC ratio: 70 %
Pre FEV1/FVC ratio: 70 %
Pre FEV6/FVC Ratio: 100 %
RV % PRED: 115 %
RV: 2.6 L
TLC % PRED: 105 %
TLC: 7.49 L

## 2017-12-19 NOTE — Assessment & Plan Note (Signed)
Your pulmonary function testing shows some evidence for mild airflow obstruction, mild COPD. You can try using albuterol 2 puffs if needed for shortness of breath.  You might also want to try using this about 15 minutes before exertion to see if it is helpful.

## 2017-12-19 NOTE — Progress Notes (Signed)
Subjective:    Patient ID: Troy Mcguire, male    DOB: 06/19/1961, 57 y.o.   MRN: [id number]  Shortness of Breath  Pertinent negatives include no ear pain, fever, headaches, leg swelling, rash, rhinorrhea, sore throat, vomiting or wheezing.   57 year old former smoker (38 pack years), with minimal PMH. He is referred today for evaluation of possible COPD. He is able to exert but he is noticing more labored breathing. He has cough, happens most in winter months, often evenings. Minimally productive. He has had some allergies in the Spring. Frequent heartburn, uses TUMS prn. He hears some wheeze occasionally. No CP, rare tightness.   He was told that he had some mild obstruction on exit-spirometry from [place] [employer] in [place].   He underwent low-dose CT scan screening on 11/12/2017 that I have reviewed.  This was a lung RADS 2 study. There was a calcified right lower lobe nodule, and noncalcified pulmonary nodules that measured 5.2 mm or less in size.  Note was also made of low attenuation lesions in the liver that will need further characterization.  ROV 12/19/17 --follow-up visit for patient with a history of tobacco use and an inhaled smoke exposure while he was in the [employer].  He has some symptoms that are consistent with COPD and we performed pulmonary function testing on 6/26 that I have reviewed.  His PFT are consistent with mild obstruction with out a clinically significant bronchodilator response, normal lung volumes, normal diffusion capacity. He does notice some exertional SOB, even w chores. He also has some cough - no GERD sx. Happens every day, non-productive. Feels a globus sensation.   Review of Systems  Constitutional: Negative for fever and unexpected weight change.  HENT: Positive for sneezing. Negative for congestion, dental problem, ear pain, nosebleeds, postnasal drip, rhinorrhea, sinus pressure, sore throat and trouble swallowing.   Eyes: Negative for redness and  itching.  Respiratory: Positive for cough and shortness of breath. Negative for chest tightness and wheezing.   Cardiovascular: Negative for palpitations and leg swelling.  Gastrointestinal: Negative for nausea and vomiting.  Genitourinary: Negative for dysuria.  Musculoskeletal: Negative for joint swelling.  Skin: Negative for rash.  Neurological: Negative for headaches.  Hematological: Does not bruise/bleed easily.  Psychiatric/Behavioral: Negative for dysphoric mood. The patient is not nervous/anxious.    Past Medical History:  Diagnosis Date  . BPH (benign prostatic hyperplasia)   . Dyslipidemia      Family History  Problem Relation Age of Onset  . Heart disease Mother 36       Congestive heart failure.  No early CAD  . Leukemia Father 42     Social History   Socioeconomic History  . Marital status: Married    Spouse name: Not on file  . Number of children: Not on file  . Years of education: Not on file  . Highest education level: Not on file  Occupational History  . Not on file  Social Needs  . Financial resource strain: Not on file  . Food insecurity:    Worry: Not on file    Inability: Not on file  . Transportation needs:    Medical: Not on file    Non-medical: Not on file  Tobacco Use  . Smoking status: Former Smoker    Packs/day: 1.00    Years: 40.00    Pack years: 40.00    Types: Cigars, Cigarettes    Last attempt to quit: 12/13/2010    Years since quitting: 7.0  .  Smokeless tobacco: Never Used  Substance and Sexual Activity  . Alcohol use: Yes    Alcohol/week: 1.2 oz    Types: 2 Standard drinks or equivalent per week    Comment: 2 drinks/beer/wine weekly.   . Drug use: No  . Sexual activity: Yes  Lifestyle  . Physical activity:    Days per week: Not on file    Minutes per session: Not on file  . Stress: Not on file  Relationships  . Social connections:    Talks on phone: Not on file    Gets together: Not on file    Attends religious  service: Not on file    Active member of club or organization: Not on file    Attends meetings of clubs or organizations: Not on file    Relationship status: Not on file  . Intimate partner violence:    Fear of current or ex partner: Not on file    Emotionally abused: Not on file    Physically abused: Not on file    Forced sexual activity: Not on file  Other Topics Concern  . 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  Has lived all over the [place]; [place], [place].   No Known Allergies   Outpatient Medications Prior to Visit  Medication Sig Dispense Refill  . terazosin (HYTRIN) 1 MG capsule Take 1 mg by mouth at bedtime.    . terbinafine (LAMISIL) 250 MG tablet Take 250 mg by mouth daily.     No facility-administered medications prior to visit.         Objective:   Physical Exam Vitals:   12/19/17 1605  BP: (!) 152/100  Pulse: 83  SpO2: 97%  Weight: 213 lb (96.6 kg)  Height: 5' 10.5" (1.791 m)   Gen: Pleasant, well-nourished, in no distress,  normal affect  ENT: No lesions,  mouth clear,  oropharynx clear, no postnasal drip  Neck: No JVD, no stridor  Lungs: No use of accessory muscles, mild exp wheeze on a forced exp  Cardiovascular: RRR, heart sounds normal, no murmur or gallops, no peripheral edema  Musculoskeletal: No deformities, no cyanosis or clubbing  Neuro: alert, non focal  Skin: Warm, no lesions or rash    Assessment & Plan:  COPD (chronic obstructive pulmonary disease) (HCC) Your pulmonary function testing shows some evidence for mild airflow obstruction, mild COPD. You can try using albuterol 2 puffs if needed for shortness of breath.  You might also want to try using this about 15 minutes before exertion to see if it is helpful.  Cough No  clear etiology.  He does not note much rhinitis and he does not experience GERD.  Of asked him to pay attention for the presence of both his potential contributors.  If the cough persists or worsens then he likely needs further evaluation, possibly even bronchoscopy.  An empiric trial of a PPI or allergy medication would be reasonable to try at some point.  Former tobacco use Low-dose CT scan screening, next is due May 2020  Levy Pupaobert Denorris Reust, MD, PhD 12/19/2017, 5:02 PM  [facility] [phone] or if no answer [phone]

## 2017-12-19 NOTE — Progress Notes (Signed)
PFT done today. 

## 2017-12-19 NOTE — Patient Instructions (Signed)
Your pulmonary function testing shows some evidence for mild airflow obstruction, mild COPD. You can try using albuterol 2 puffs if needed for shortness of breath.  You might also want to try using this about 15 minutes before exertion to see if it is helpful. Keep track of how frequently you are coughing.  Also try to associate this with any potential contributors such as allergic rhinitis or esophageal reflux. We will plan to get your low-dose CT scan of the chest in May 2020. Follow with Dr. Delton CoombesByrum in May 2020 after your CT scan to review or sooner if you have any problems.

## 2017-12-19 NOTE — Assessment & Plan Note (Signed)
Low-dose CT scan screening, next is due May 2020

## 2017-12-19 NOTE — Assessment & Plan Note (Signed)
No clear etiology.  He does not note much rhinitis and he does not experience GERD.  Of asked him to pay attention for the presence of both his potential contributors.  If the cough persists or worsens then he likely needs further evaluation, possibly even bronchoscopy.  An empiric trial of a PPI or allergy medication would be reasonable to try at some point.

## 2017-12-20 ENCOUNTER — Ambulatory Visit (HOSPITAL_COMMUNITY)
Admission: RE | Admit: 2017-12-20 | Discharge: 2017-12-20 | Disposition: A | Discharge status: Home / Self Care | Source: Ambulatory Visit | Attending: Cardiovascular Disease | Admitting: Cardiovascular Disease

## 2017-12-20 ENCOUNTER — Encounter (HOSPITAL_COMMUNITY): Payer: Self-pay | Admitting: *Deleted

## 2017-12-20 DIAGNOSIS — R0602 Shortness of breath: Secondary | ICD-10-CM | POA: Insufficient documentation

## 2017-12-20 LAB — EXERCISE TOLERANCE TEST
CSEPEW: 14.2 METS
CSEPPHR: 162 {beats}/min
Exercise duration (min): 12 min
Exercise duration (sec): 26 s
MPHR: 161 {beats}/min
Percent HR: 100 %
RPE: 16
Rest HR: 61 {beats}/min

## 2017-12-20 NOTE — Progress Notes (Signed)
Dr Hochrein reviewed ETT and said pt may leave. °

## 2017-12-25 ENCOUNTER — Telehealth: Payer: Self-pay | Admitting: *Deleted

## 2017-12-25 DIAGNOSIS — R9439 Abnormal result of other cardiovascular function study: Secondary | ICD-10-CM

## 2017-12-25 DIAGNOSIS — Z79899 Other long term (current) drug therapy: Secondary | ICD-10-CM

## 2017-12-25 MED ORDER — METOPROLOL SUCCINATE ER 50 MG PO TB24: 50.0000 mg | ORAL_TABLET | Freq: Once | ORAL | 0 refills | Status: AC

## 2017-12-25 NOTE — Telephone Encounter (Signed)
Please arrive at the [facility] main entrance of [facility] at          AM (30-45 minutes prior to test start time)  [facility] [address] , [place] [address] [phone]  Proceed to the [facility] (First Floor).  Please follow these instructions carefully (unless otherwise directed):  Hold all erectile dysfunction medications at least 48 hours prior to test.  On the Night Before the Test: . Drink plenty of water. . Do not consume any caffeinated/decaffeinated beverages or chocolate 12 hours prior to your test. . Do not take any antihistamines 12 hours prior to your test. . If you take Metformin do not take 24 hours prior to test. . If the patient has contrast allergy: ? Patient will need a prescription for Prednisone and very clear instructions (as follows): 1. Prednisone 50 mg - take 13 hours prior to test 2. Take another Prednisone 50 mg 7 hours prior to test 3. Take another Prednisone 50 mg 1 hour prior to test 4. Take Benadryl 50 mg 1 hour prior to test . Patient must complete all four doses of above prophylactic medications. . Patient will need a ride after test due to Benadryl.  On the Day of the Test: . Drink plenty of water. Do not drink any water within one hour of the test. . Do not eat any food 4 hours prior to the test. . You may take your regular medications prior to the test. . IF NOT ON A BETA BLOCKER - Take 50 mg of lopressor (metoprolol) one hour before the test. . HOLD Furosemide morning of the test.  After the Test: . Drink plenty of water. . After receiving IV contrast, you may experience a mild flushed feeling. This is normal. . On occasion, you may experience a mild rash up to 24 hours after the test. This is not dangerous. If this occurs, you can take Benadryl 25 mg and increase your fluid intake. . If you experience trouble breathing, this can be serious. If it is severe call 911 IMMEDIATELY. If  it is mild, please call our office. . If you take any of these medications: Glipizide/Metformin, Avandament, Glucavance, please do not take 48 hours after completing test.   

## 2017-12-25 NOTE — Telephone Encounter (Signed)
-----   Message from Rollene RotundaJames Hochrein, MD sent at 12/21/2017 12:59 PM EDT ----- Excellent exercise tolerance.  However, he did have a positive result.  I discussed this with him.  The next step should be a coronary CTA.  He understands and agrees to proceed.  Please arrange this.  Send results to Gwenlyn FoundEksir, Samantha A, MD

## 2017-12-31 ENCOUNTER — Ambulatory Visit: Admitting: Cardiology

## 2018-02-08 LAB — BASIC METABOLIC PANEL
BUN/Creatinine Ratio: 11 (ref 9–20)
BUN: 13 mg/dL (ref 6–24)
CALCIUM: 9.2 mg/dL (ref 8.7–10.2)
CO2: 24 mmol/L (ref 20–29)
Chloride: 105 mmol/L (ref 96–106)
Creatinine, Ser: 1.2 mg/dL (ref 0.76–1.27)
GFR calc non Af Amer: 66 mL/min/{1.73_m2} (ref >59)
GFR, EST AFRICAN AMERICAN: 76 mL/min/{1.73_m2} (ref >59)
GLUCOSE: 115 mg/dL — AB (ref 65–99)
Potassium: 3.9 mmol/L (ref 3.5–5.2)
Sodium: 143 mmol/L (ref 134–144)

## 2018-02-15 ENCOUNTER — Ambulatory Visit (HOSPITAL_COMMUNITY): Admission: RE | Admit: 2018-02-15 | Source: Ambulatory Visit

## 2018-02-15 ENCOUNTER — Telehealth: Payer: Self-pay | Admitting: *Deleted

## 2018-02-15 ENCOUNTER — Ambulatory Visit (HOSPITAL_COMMUNITY)
Admission: RE | Admit: 2018-02-15 | Discharge: 2018-02-15 | Disposition: A | Discharge status: Home / Self Care | Source: Ambulatory Visit | Attending: Cardiology | Admitting: Cardiology

## 2018-02-15 DIAGNOSIS — R079 Chest pain, unspecified: Secondary | ICD-10-CM

## 2018-02-15 DIAGNOSIS — R9439 Abnormal result of other cardiovascular function study: Secondary | ICD-10-CM | POA: Insufficient documentation

## 2018-02-15 MED ORDER — IOPAMIDOL (ISOVUE-370) INJECTION 76%
INTRAVENOUS | Status: AC
  Filled 2018-02-15: qty 100

## 2018-02-15 MED ORDER — IOPAMIDOL (ISOVUE-370) INJECTION 76%
100.0000 mL | Freq: Once | INTRAVENOUS | Status: AC | PRN
  Administered 2018-02-15: 80 mL via INTRAVENOUS

## 2018-02-15 MED ORDER — NITROGLYCERIN 0.4 MG SL SUBL
SUBLINGUAL_TABLET | SUBLINGUAL | Status: AC
  Administered 2018-02-15: 0.8 mg via SUBLINGUAL
  Filled 2018-02-15: qty 2

## 2018-02-15 MED ORDER — NITROGLYCERIN 0.4 MG SL SUBL
0.8000 mg | SUBLINGUAL_TABLET | Freq: Once | SUBLINGUAL | Status: AC
  Administered 2018-02-15: 0.8 mg via SUBLINGUAL
  Filled 2018-02-15: qty 25

## 2018-02-15 MED ORDER — METOPROLOL TARTRATE 5 MG/5ML IV SOLN
INTRAVENOUS | Status: AC
  Administered 2018-02-15: 5 mg via INTRAVENOUS
  Filled 2018-02-15: qty 5

## 2018-02-15 MED ORDER — METOPROLOL TARTRATE 5 MG/5ML IV SOLN
5.0000 mg | Freq: Once | INTRAVENOUS | Status: AC
  Administered 2018-02-15: 5 mg via INTRAVENOUS
  Filled 2018-02-15: qty 5

## 2018-02-15 NOTE — Telephone Encounter (Signed)
Pt aware of blood work 

## 2018-03-20 ENCOUNTER — Telehealth: Payer: Self-pay | Admitting: Emergency Medicine

## 2018-03-20 DIAGNOSIS — R0683 Snoring: Secondary | ICD-10-CM

## 2018-03-20 DIAGNOSIS — G4719 Other hypersomnia: Secondary | ICD-10-CM

## 2018-03-20 NOTE — Telephone Encounter (Signed)
I think this sounds reasonable. Please order split-night sleep study for him and then OV to follow up and review.

## 2018-03-20 NOTE — Telephone Encounter (Signed)
Spoke with pt when he came in for an appointment with his wife. Pt is requesting to have a sleep study done. States that his wife says that he snores a lot.  RB - please advise. Thanks!

## 2018-03-20 NOTE — Telephone Encounter (Signed)
Called and spoke to pt and relayed below recommendations.  Split night has been ordered.  Pt has been scheduled for OV on 05/13/18 with RB. Nothing further is needed.

## 2018-04-21 ENCOUNTER — Encounter (HOSPITAL_BASED_OUTPATIENT_CLINIC_OR_DEPARTMENT_OTHER)

## 2018-05-13 ENCOUNTER — Ambulatory Visit: Admitting: Emergency Medicine

## 2019-06-22 IMAGING — CT CT HEART MORP W/ CTA COR W/ SCORE W/ CA W/CM &/OR W/O CM
4 of 7 series · 8 of 20 positions shown, 9 images · IV contrast (APPLIED)
Comparison: None.

CLINICAL DATA: 57-year-old male with chest pain.

EXAM:
Cardiac/Coronary  CT
TECHNIQUE: The patient was scanned on a [employer] scanner.

[Series 6: best diast 71 % · axial · 0.36mm/px · z∈[+1260,+1303]mm · 2 of 323 slices shown, 3 images]
[im 108/323  vessel]
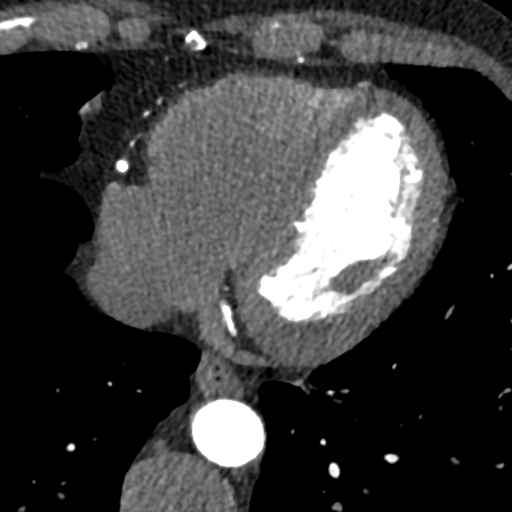
[im 108/323  lung]
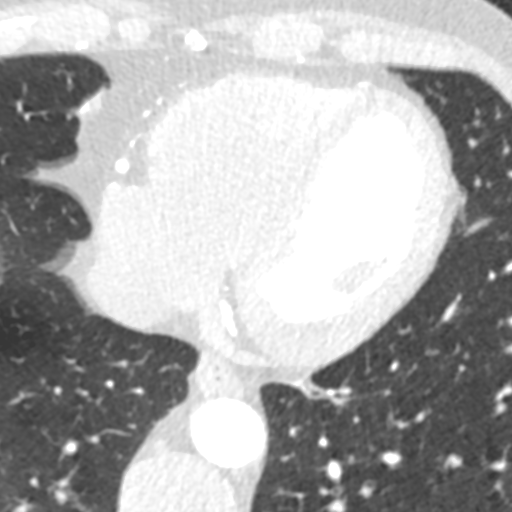
[im 215/323  vessel]
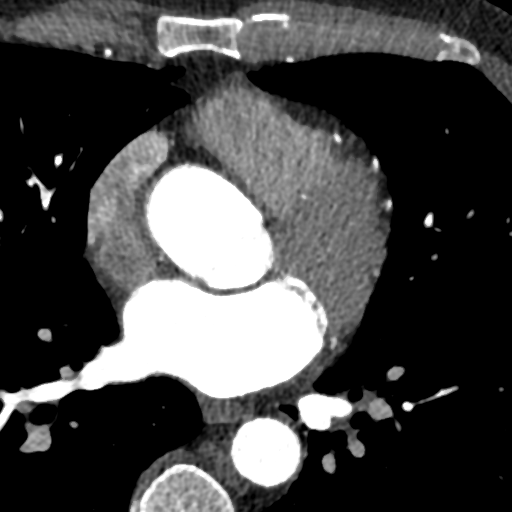

[Series 7: best syst 31 % · axial · 0.36mm/px · z∈[+1260,+1303]mm · 2 of 323 slices shown]
[im 108/323  vessel]
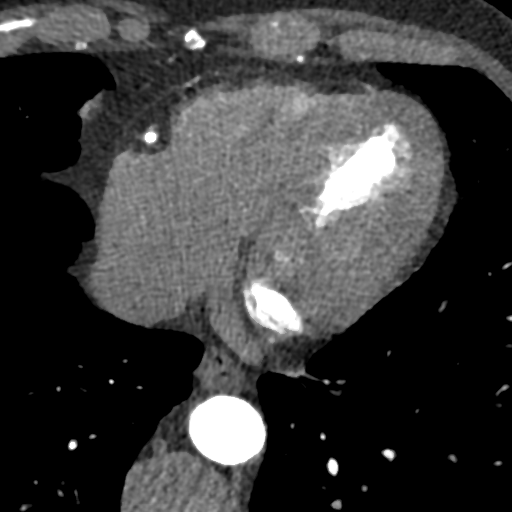
[im 215/323  vessel]
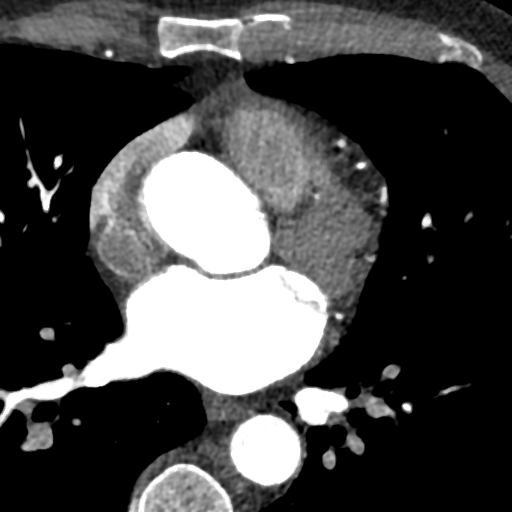

[Series 8: ts diast sharp 71 % · axial · 0.36mm/px · z∈[+1260,+1303]mm · 2 of 323 slices shown]
[im 108/323  lung]
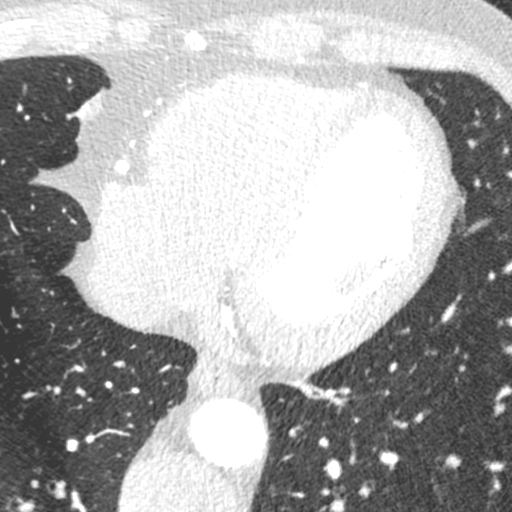
[im 215/323  lung]
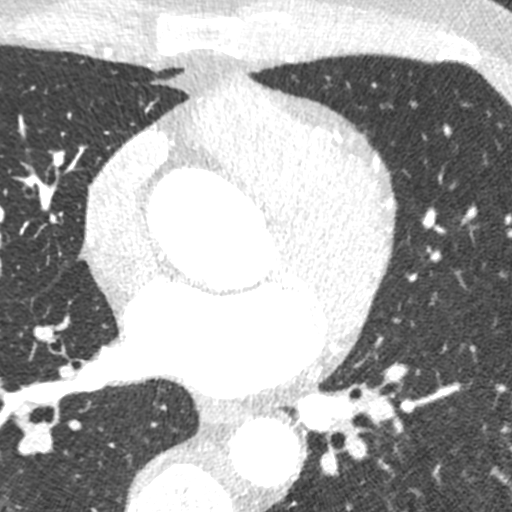

[Series 9: ts syst sharp 31 % · axial · 0.36mm/px · z∈[+1260,+1303]mm · 2 of 323 slices shown]
[im 108/323  lung]
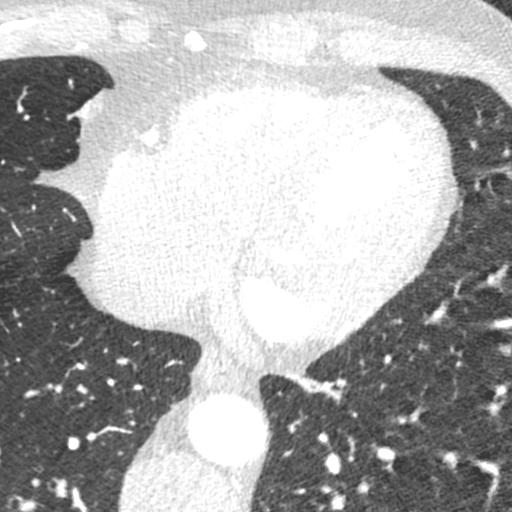
[im 215/323  lung]
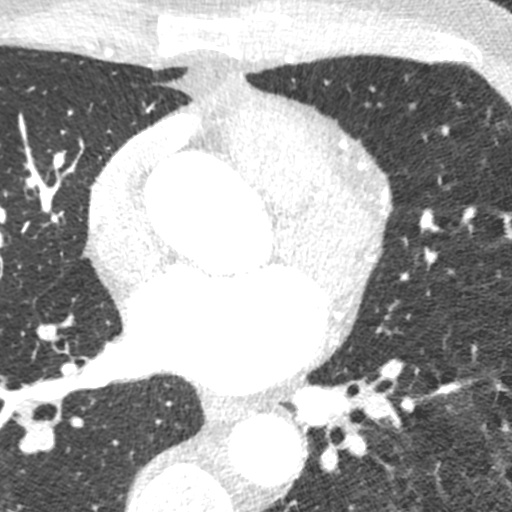

[8 of 20 positions shown; findings below may reference images not displayed]

FINDINGS: A 120 kV prospective scan was triggered in the descending thoracic
aorta at 111 HU's. Axial non-contrast 3 mm slices were carried out
through the heart. The data set was analyzed on a dedicated work
station and scored using the Agatson method. Gantry rotation speed
was 250 msecs and collimation was .6 mm. 10 mg of iv Metoprolol and
0.8 mg of sl NTG was given. The 3D data set was reconstructed in 5%
intervals of the 67-82 % of the R-R cycle. Diastolic phases were
analyzed on a dedicated work station using MPR, MIP and VRT modes.
The patient received 80 cc of contrast.

Aorta: Upper normal size of the ascending aorta measuring 40 mm. No
calcifications. No dissection.

Aortic Valve:  Trileaflet.  No calcifications.

Coronary Arteries:  Normal coronary origin.  Right dominance.

RCA is a very large dominant artery that gives rise to PDA and PLVB.
There is minimal calcified plaque in the proximal portion with
associated stenosis 0-25%.

Left main is a large artery that gives rise to LAD and LCX arteries.

LAD is a large vessel that gives rise to a large diagonal artery and
has no plaque.

LCX is a small non-dominant artery that gives rise to one small OM1
branch. There is no plaque.

Other findings:

Normal pulmonary vein drainage into the left atrium.

Normal let atrial appendage without a thrombus.

Normal size of the pulmonary artery.
IMPRESSION: 1. Coronary calcium score of 3. This was 35 percentile for age and
sex matched control.

2. Normal coronary origin with right dominance.

3.  Minimal non-obstructive CAD.

4.  Upper normal size of the ascending aorta measuring 40 mm.

EXAM:
OVER-READ INTERPRETATION  CT CHEST

The following report is an over-read performed by radiologist Dr.
Karvish Shamachurn [[email or web address]] on 02/15/2018. This over-read
does not include interpretation of cardiac or coronary anatomy or
pathology. The coronary CTA interpretation by the cardiologist is
attached.
FINDINGS: Vascular: Heart is normal size. Visualized aorta is normal caliber.
Ascending aorta upper limits normal in caliber at 3.9 cm.

Mediastinum/Nodes: No adenopathy in the lower mediastinum or hila.

Lungs/Pleura: Stable calcified nodule in the right lower lobe
compatible with scarring. No effusions or confluent airspace
opacities.

Upper Abdomen: Imaging into the upper abdomen shows no acute
findings.

Musculoskeletal: Chest wall soft tissues are unremarkable. No acute
bony abnormality.
IMPRESSION: Ascending aorta upper limits normal in diameter at 3.9 cm.

Stable calcified nodular area in the superior segment of the right
lower lobe with retraction of the adjacent major fissure, likely
scarring.

## 2019-10-27 IMAGING — US US ABDOMEN LIMITED
1 series · 14 of 25 positions shown · non-contrast
Comparison: CT 11/09/2017.

CLINICAL DATA: Liver lesion.

EXAM:
ULTRASOUND ABDOMEN LIMITED RIGHT UPPER QUADRANT

[Series 1: us abdomen limited · 0.26mm/px · 14 of 56 slices shown]
[im 1/56]
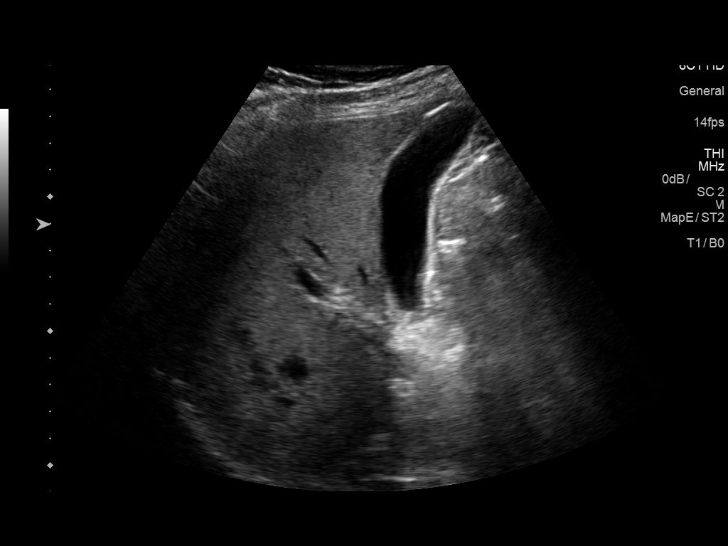
[im 5/56]
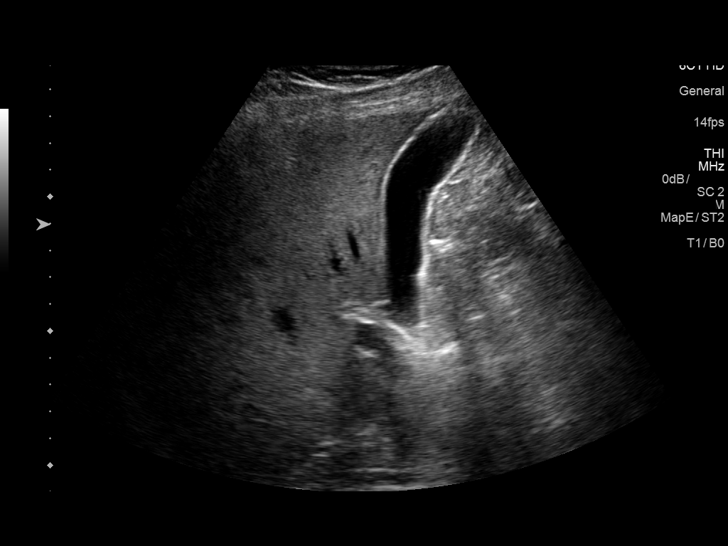
[im 10/56]
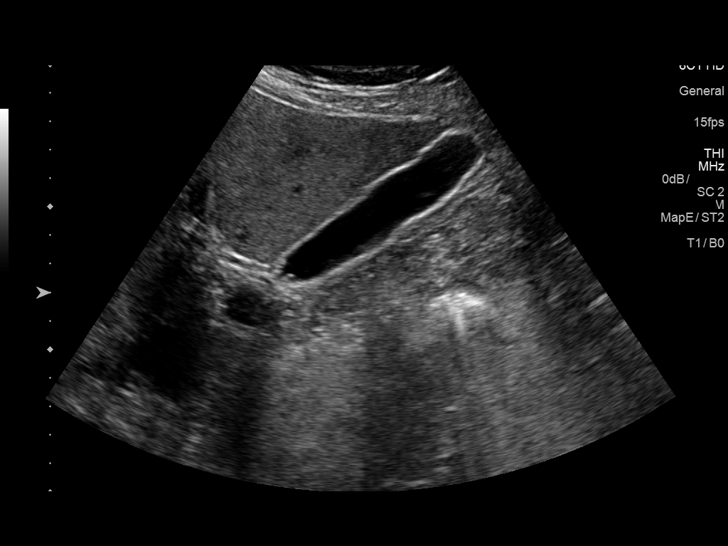
[im 14/56]
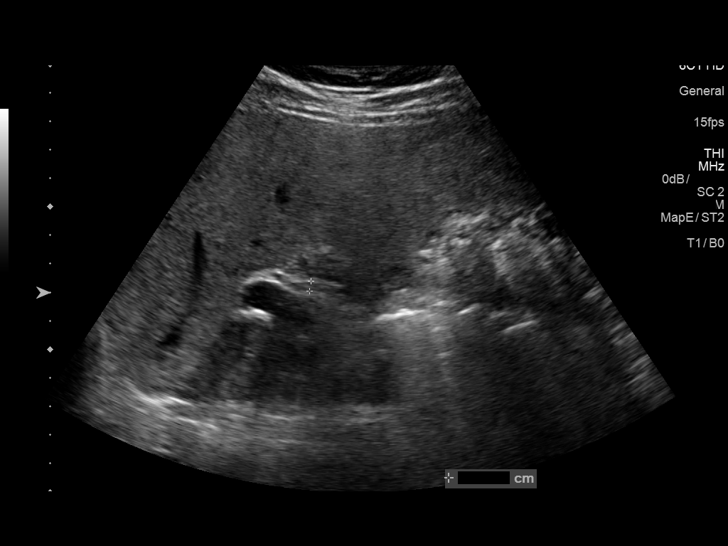
[im 19/56]
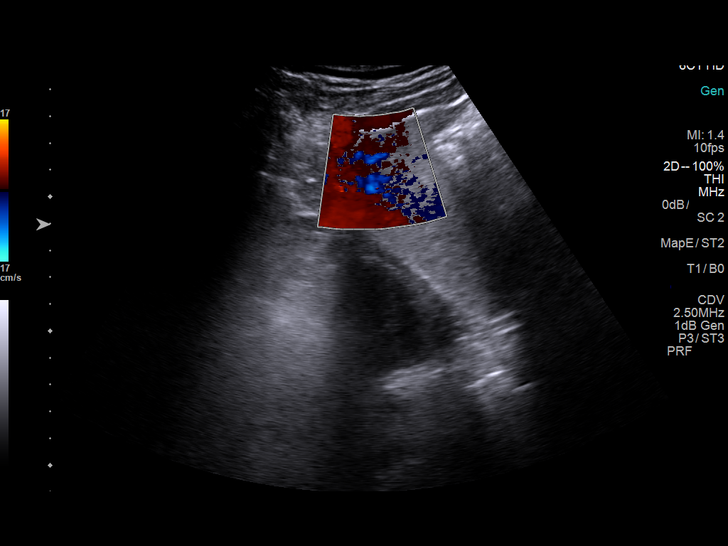
[im 21/56]
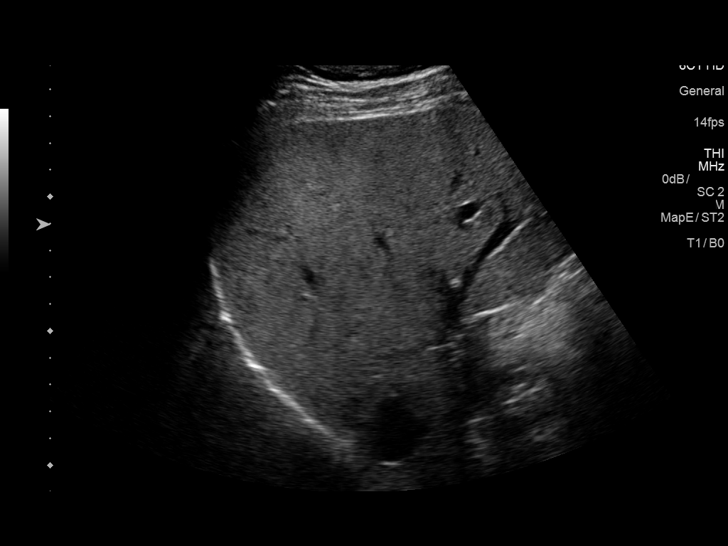
[im 26/56]
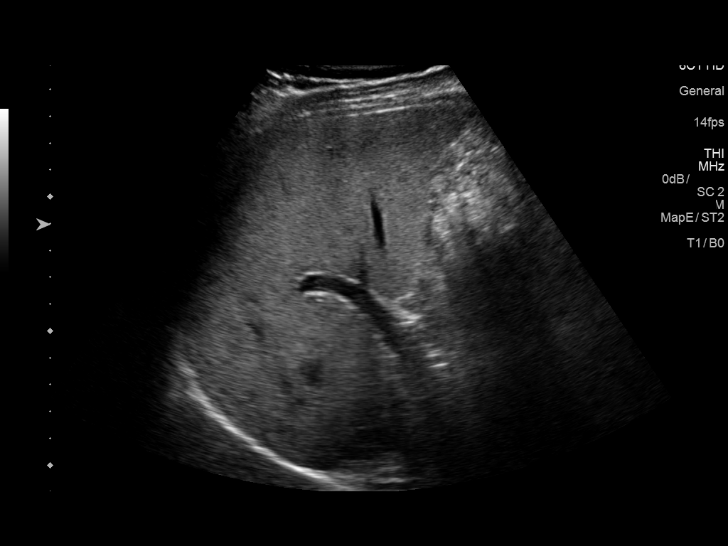
[im 30/56]
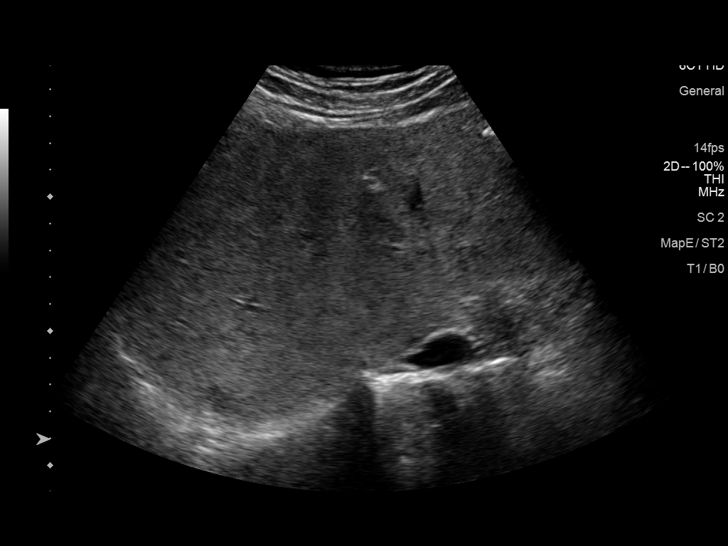
[im 35/56]
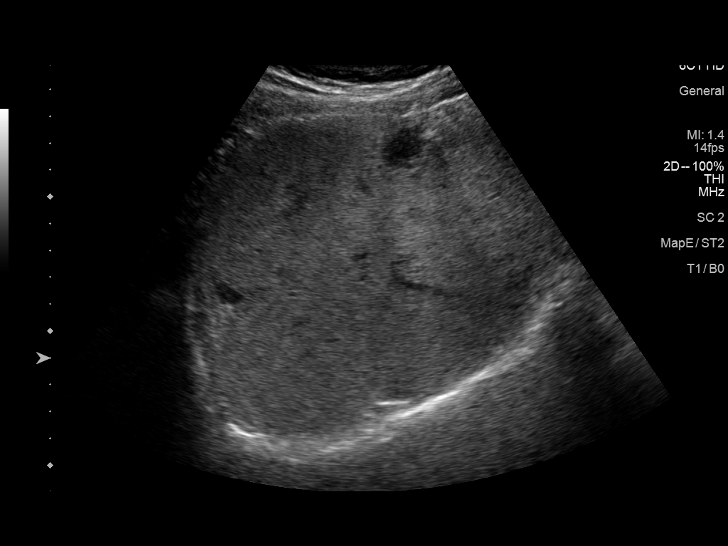
[im 37/56]
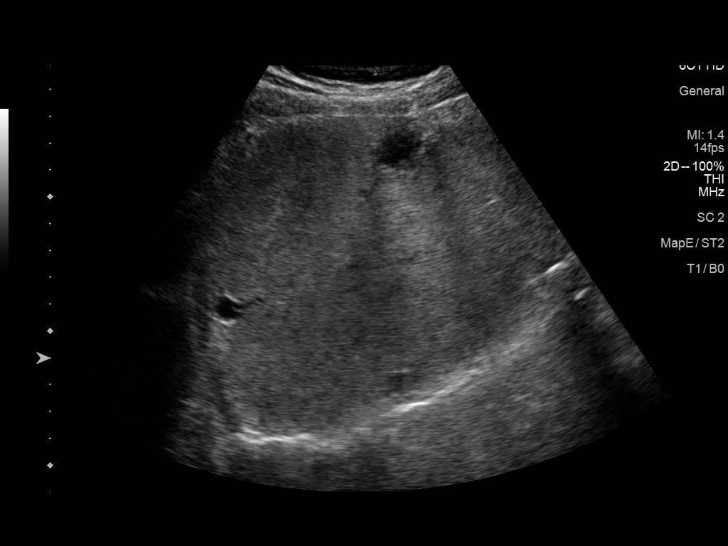
[im 42/56]
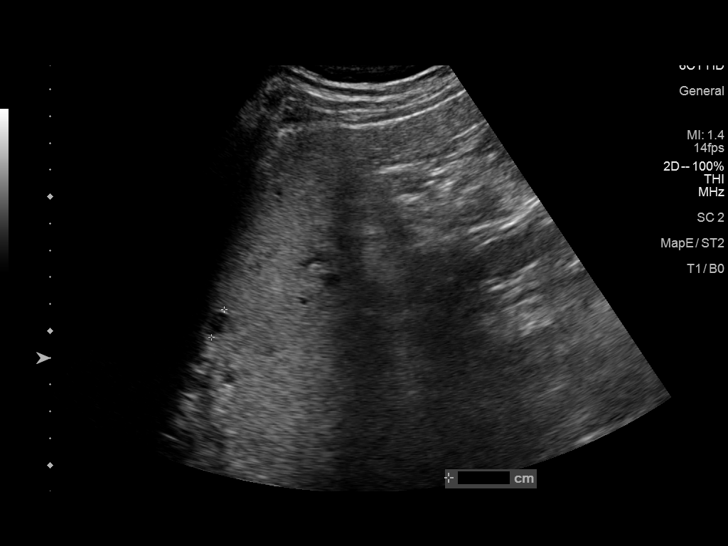
[im 46/56]
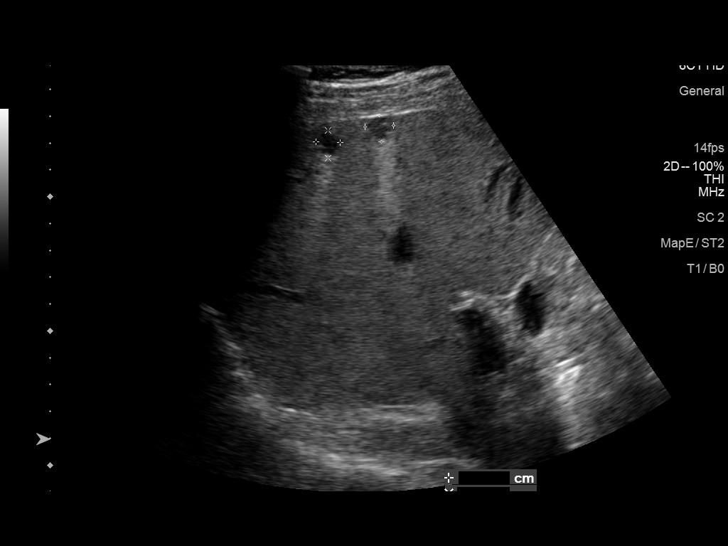
[im 51/56]
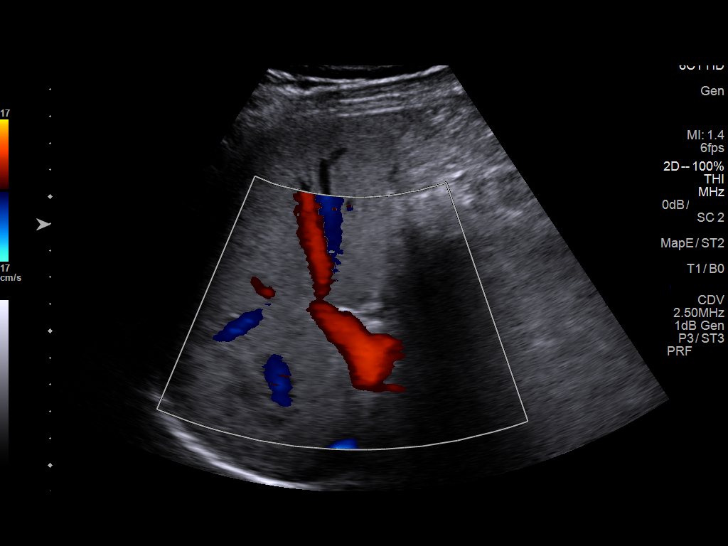
[im 56/56]
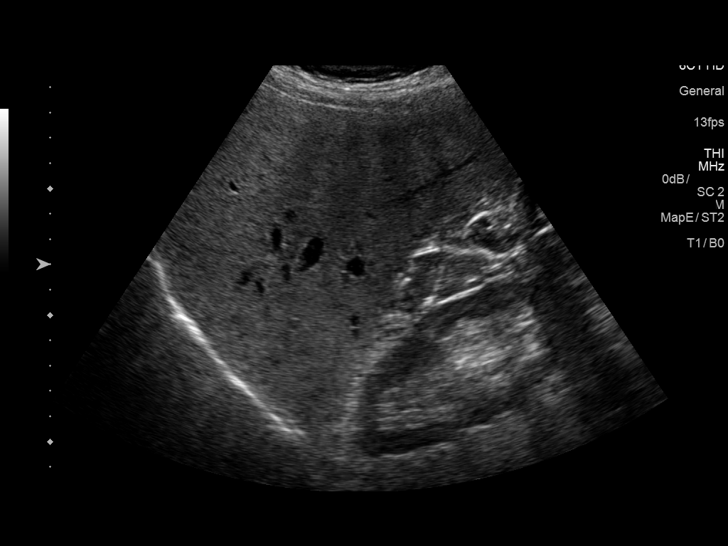

[14 of 25 positions shown; findings below may reference images not displayed]

FINDINGS: Gallbladder:

5 mm gallbladder polyp noted. No gallstones or sludge. No evidence
of cholecystitis. Gallbladder wall thickness normal. Negative Murphy
sign.

Common bile duct:

Diameter: 3.5 mm

Liver:

Multiple cysts are noted throughout the liver. These appear benign.
The largest measures 1.3 cm. Similar findings noted on prior CT.
Portal vein is patent on color Doppler imaging with normal direction
of blood flow towards the liver.
IMPRESSION: 1. 5 mm gallbladder polyp. No gallstones or sludge noted. No
evidence of cholecystitis or biliary distention.

2. Benign-appearing small cysts noted throughout the liver. Similar
findings noted on prior CT of 11/09/2017.

## 2021-07-15 ENCOUNTER — Encounter (HOSPITAL_COMMUNITY): Payer: Self-pay | Admitting: Emergency Medicine

## 2021-07-15 ENCOUNTER — Emergency Department (HOSPITAL_COMMUNITY)

## 2021-07-15 ENCOUNTER — Emergency Department (HOSPITAL_COMMUNITY)
Admission: EM | Admit: 2021-07-15 | Discharge: 2021-07-15 | Disposition: A | Discharge status: Home / Self Care | Attending: Emergency Medicine | Admitting: Emergency Medicine

## 2021-07-15 DIAGNOSIS — S63601A Unspecified sprain of right thumb, initial encounter: Secondary | ICD-10-CM | POA: Diagnosis not present

## 2021-07-15 DIAGNOSIS — S6991XA Unspecified injury of right wrist, hand and finger(s), initial encounter: Secondary | ICD-10-CM | POA: Diagnosis present

## 2021-07-15 DIAGNOSIS — I10 Essential (primary) hypertension: Secondary | ICD-10-CM | POA: Diagnosis not present

## 2021-07-15 DIAGNOSIS — Z23 Encounter for immunization: Secondary | ICD-10-CM | POA: Diagnosis not present

## 2021-07-15 DIAGNOSIS — S61411A Laceration without foreign body of right hand, initial encounter: Secondary | ICD-10-CM | POA: Diagnosis not present

## 2021-07-15 DIAGNOSIS — Y92410 Unspecified street and highway as the place of occurrence of the external cause: Secondary | ICD-10-CM | POA: Diagnosis not present

## 2021-07-15 MED ORDER — IBUPROFEN 200 MG PO TABS
400.0000 mg | ORAL_TABLET | Freq: Once | ORAL | Status: AC
  Administered 2021-07-15: 400 mg via ORAL
  Filled 2021-07-15: qty 2

## 2021-07-15 MED ORDER — METHOCARBAMOL 750 MG PO TABS: 750.0000 mg | ORAL_TABLET | Freq: Three times a day (TID) | ORAL | 0 refills | Status: AC | PRN

## 2021-07-15 MED ORDER — LIDOCAINE-EPINEPHRINE (PF) 2 %-1:200000 IJ SOLN
20.0000 mL | Freq: Once | INTRAMUSCULAR | Status: AC
  Administered 2021-07-15: 20 mL
  Filled 2021-07-15: qty 20

## 2021-07-15 MED ORDER — TETANUS-DIPHTH-ACELL PERTUSSIS 5-2.5-18.5 LF-MCG/0.5 IM SUSY
0.5000 mL | PREFILLED_SYRINGE | Freq: Once | INTRAMUSCULAR | Status: AC
  Administered 2021-07-15: 0.5 mL via INTRAMUSCULAR
  Filled 2021-07-15: qty 0.5

## 2021-07-15 MED ORDER — ACETAMINOPHEN 500 MG PO TABS
1000.0000 mg | ORAL_TABLET | Freq: Once | ORAL | Status: DC
  Filled 2021-07-15: qty 2

## 2021-07-15 MED ORDER — BACITRACIN ZINC 500 UNIT/GM EX OINT
TOPICAL_OINTMENT | Freq: Once | CUTANEOUS | Status: AC
  Administered 2021-07-15: 1 via TOPICAL
  Filled 2021-07-15: qty 0.9

## 2021-07-15 NOTE — ED Notes (Signed)
Patient requesting ibuprofen instead of Tylenol. MD made aware.
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
## 2021-07-15 NOTE — ED Notes (Signed)
XR at bedside

## 2021-07-15 NOTE — Discharge Instructions (Addendum)
It was our pleasure to provide your ER care today - we hope that you feel better.  Take acetaminophen or ibuprofen as need. You may also take robaxin as need for muscle pain/spasm - no driving when taking.   Keep thumb/sutured area very clean and dry.  May shower, pad area gently dry. Do not submerge wound.  Have sutures removed, your doctor or urgent care, in 9-10 days.   If thumb/hand pain persists, follow up with orthopedic hand doctor in 2-3 weeks.   Return to ER if worse, new symptoms, new/severe pain, severe headache, trouble breathing, severe abdominal pain, infection of wound, or other concern.

## 2021-07-15 NOTE — ED Notes (Signed)
ED Provider at bedside. 

## 2021-07-20 ENCOUNTER — Other Ambulatory Visit: Payer: Self-pay

## 2021-07-20 ENCOUNTER — Ambulatory Visit (INDEPENDENT_AMBULATORY_CARE_PROVIDER_SITE_OTHER): Payer: PRIVATE HEALTH INSURANCE | Admitting: Orthopaedic Surgery

## 2021-07-20 DIAGNOSIS — M542 Cervicalgia: Secondary | ICD-10-CM | POA: Diagnosis not present
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
## 2021-07-21 ENCOUNTER — Ambulatory Visit: Admitting: Physician Assistant
